# Patient Record
Sex: Female | Born: 1980 | Race: White | Hispanic: No | Smoking: Former smoker
Health system: Southern US, Community
[De-identification: ages and names within clinical notes are randomized; demographics above are authoritative.]

## PROBLEM LIST (undated history)

## (undated) DIAGNOSIS — Z8619 Personal history of other infectious and parasitic diseases: Secondary | ICD-10-CM

## (undated) DIAGNOSIS — O24419 Gestational diabetes mellitus in pregnancy, unspecified control: Secondary | ICD-10-CM

## (undated) HISTORY — DX: Personal history of other infectious and parasitic diseases: Z86.19

## (undated) HISTORY — DX: Gestational diabetes mellitus in pregnancy, unspecified control: O24.419

## (undated) HISTORY — PX: WISDOM TOOTH EXTRACTION: SHX21

---
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
## 1998-06-29 ENCOUNTER — Other Ambulatory Visit: Admission: RE | Admit: 1998-06-29 | Discharge: 1998-06-29 | Payer: Self-pay | Admitting: Obstetrics and Gynecology

## 1999-07-21 ENCOUNTER — Other Ambulatory Visit: Admission: RE | Admit: 1999-07-21 | Discharge: 1999-07-21 | Payer: Self-pay | Admitting: Obstetrics and Gynecology

## 2000-10-13 ENCOUNTER — Inpatient Hospital Stay (HOSPITAL_COMMUNITY): Admission: AD | Admit: 2000-10-13 | Discharge: 2000-10-15 | Payer: Self-pay | Admitting: Obstetrics and Gynecology

## 2001-09-05 ENCOUNTER — Other Ambulatory Visit: Admission: RE | Admit: 2001-09-05 | Discharge: 2001-09-05 | Payer: Self-pay | Admitting: Obstetrics and Gynecology

## 2012-08-28 LAB — OB RESULTS CONSOLE HEPATITIS B SURFACE ANTIGEN: Hepatitis B Surface Ag: NEGATIVE

## 2012-08-28 LAB — OB RESULTS CONSOLE ANTIBODY SCREEN: Antibody Screen: NEGATIVE

## 2012-08-28 LAB — OB RESULTS CONSOLE HIV ANTIBODY (ROUTINE TESTING): HIV: NONREACTIVE

## 2012-08-28 LAB — OB RESULTS CONSOLE RUBELLA ANTIBODY, IGM: Rubella: IMMUNE

## 2012-08-28 LAB — OB RESULTS CONSOLE ABO/RH: RH Type: POSITIVE

## 2012-08-28 LAB — OB RESULTS CONSOLE RPR: RPR: NONREACTIVE

## 2012-09-12 LAB — OB RESULTS CONSOLE GC/CHLAMYDIA
CHLAMYDIA, DNA PROBE: NEGATIVE
GC PROBE AMP, GENITAL: NEGATIVE

## 2013-02-06 ENCOUNTER — Encounter: Payer: BC Managed Care – PPO | Attending: Certified Nurse Midwife

## 2013-02-06 DIAGNOSIS — Z713 Dietary counseling and surveillance: Secondary | ICD-10-CM | POA: Insufficient documentation

## 2013-02-06 DIAGNOSIS — O99810 Abnormal glucose complicating pregnancy: Secondary | ICD-10-CM | POA: Insufficient documentation

## 2013-02-06 NOTE — Progress Notes (Addendum)
  Patient was seen on 02/06/13 for Gestational Diabetes self-management class at the [facility]. The following learning objectives were met by the patient during this course:   States the definition of Gestational Diabetes  States why dietary management is important in controlling blood glucose  Describes the effects of carbohydrates on blood glucose levels  Demonstrates ability to create a balanced meal plan  Demonstrates carbohydrate counting   States when to check blood glucose levels  Demonstrates proper blood glucose monitoring techniques  States the effect of stress and exercise on blood glucose levels  States the importance of limiting caffeine and abstaining from alcohol and smoking  Plan:  Aim for 2 Carb Choices per meal (30 grams) +/- 1 either way for breakfast Aim for 3 Carb Choices per meal (45 grams) +/- 1 either way from lunch and dinner Aim for 1-2 Carbs per snack Begin reading food labels for Total Carbohydrate and sugar grams of foods Consider  increasing your activity level by walking daily as tolerated Begin checking BG before breakfast and 1-2 hours after first bit of breakfast, lunch and dinner after  as directed by MD  Take medication  as directed by MD  Blood glucose monitor given:None...already testing per MD (correction)  Patient instructed to monitor glucose levels: FBS: 60 - <90 2 hour: <120  Patient received the following handouts:  Nutrition Diabetes and Pregnancy  Carbohydrate Counting List  Meal Planning worksheet  Patient will be seen for follow-up as needed.

## 2013-02-21 NOTE — L&D Delivery Note (Signed)
Delivery Note At 1:34 PM a viable and healthy female was delivered via Vaginal, Spontaneous Delivery (Presentation: LOA ).  APGAR: 8, 9; weight .   Placenta status: Intact, Spontaneous.  Cord: 3 vessels with the following complications: None.  Cord pH: na  Anesthesia: Epidural  Episiotomy: None Lacerations: None Suture Repair: na Est. Blood Loss (mL): 200  Mom to postpartum.  Baby to Couplet care / Skin to Skin.  Emmanuell Kantz J 04/05/2013, 1:54 PM

## 2013-03-06 ENCOUNTER — Inpatient Hospital Stay (HOSPITAL_COMMUNITY): Admission: AD | Admit: 2013-03-06 | Payer: Self-pay | Source: Ambulatory Visit | Admitting: Obstetrics and Gynecology

## 2013-03-06 LAB — OB RESULTS CONSOLE GBS: GBS: NEGATIVE

## 2013-03-29 ENCOUNTER — Other Ambulatory Visit: Payer: Self-pay | Admitting: Obstetrics and Gynecology

## 2013-04-01 ENCOUNTER — Encounter (HOSPITAL_COMMUNITY): Payer: Self-pay | Admitting: *Deleted

## 2013-04-01 ENCOUNTER — Telehealth (HOSPITAL_COMMUNITY): Payer: Self-pay | Admitting: *Deleted

## 2013-04-01 NOTE — Telephone Encounter (Signed)
Preadmission screen  

## 2013-04-05 ENCOUNTER — Inpatient Hospital Stay (HOSPITAL_COMMUNITY)
Admission: RE | Admit: 2013-04-05 | Discharge: 2013-04-06 | DRG: 775 | Disposition: A | Discharge status: Home / Self Care | Payer: BC Managed Care – PPO | Source: Ambulatory Visit | Attending: Obstetrics and Gynecology | Admitting: Obstetrics and Gynecology

## 2013-04-05 ENCOUNTER — Inpatient Hospital Stay (HOSPITAL_COMMUNITY): Payer: BC Managed Care – PPO | Admitting: Anesthesiology

## 2013-04-05 ENCOUNTER — Encounter (HOSPITAL_COMMUNITY): Payer: BC Managed Care – PPO | Admitting: Anesthesiology

## 2013-04-05 ENCOUNTER — Encounter (HOSPITAL_COMMUNITY): Payer: Self-pay

## 2013-04-05 DIAGNOSIS — O99814 Abnormal glucose complicating childbirth: Principal | ICD-10-CM | POA: Diagnosis present

## 2013-04-05 LAB — CBC
HCT: 35.2 % — ABNORMAL LOW (ref 36.0–46.0)
Hemoglobin: 11.9 g/dL — ABNORMAL LOW (ref 12.0–15.0)
MCH: 29.5 pg (ref 26.0–34.0)
MCHC: 33.8 g/dL (ref 30.0–36.0)
MCV: 87.1 fL (ref 78.0–100.0)
Platelets: 218 10*3/uL (ref 150–400)
RBC: 4.04 MIL/uL (ref 3.87–5.11)
RDW: 14.1 % (ref 11.5–15.5)
WBC: 11.4 10*3/uL — AB (ref 4.0–10.5)

## 2013-04-05 LAB — GLUCOSE, CAPILLARY
Glucose-Capillary: 88 mg/dL (ref 70–99)
Glucose-Capillary: 96 mg/dL (ref 70–99)

## 2013-04-05 LAB — RPR: RPR: NONREACTIVE

## 2013-04-05 MED ORDER — ZOLPIDEM TARTRATE 5 MG PO TABS: 5.0000 mg | ORAL_TABLET | Freq: Every evening | ORAL | Status: DC | PRN

## 2013-04-05 MED ORDER — TETANUS-DIPHTH-ACELL PERTUSSIS 5-2.5-18.5 LF-MCG/0.5 IM SUSP: 0.5000 mL | Freq: Once | INTRAMUSCULAR | Status: DC

## 2013-04-05 MED ORDER — OXYTOCIN 40 UNITS IN LACTATED RINGERS INFUSION - SIMPLE MED: 62.5000 mL/h | INTRAVENOUS | Status: DC

## 2013-04-05 MED ORDER — SENNOSIDES-DOCUSATE SODIUM 8.6-50 MG PO TABS
2.0000 | ORAL_TABLET | ORAL | Status: DC
  Administered 2013-04-06: 2 via ORAL
  Filled 2013-04-05: qty 2

## 2013-04-05 MED ORDER — DIBUCAINE 1 % RE OINT: 1.0000 "application " | TOPICAL_OINTMENT | RECTAL | Status: DC | PRN

## 2013-04-05 MED ORDER — DIPHENHYDRAMINE HCL 50 MG/ML IJ SOLN: 12.5000 mg | INTRAMUSCULAR | Status: DC | PRN

## 2013-04-05 MED ORDER — BUTORPHANOL TARTRATE 1 MG/ML IJ SOLN
1.0000 mg | INTRAMUSCULAR | Status: DC | PRN
  Administered 2013-04-05: 1 mg via INTRAVENOUS
  Filled 2013-04-05: qty 1

## 2013-04-05 MED ORDER — LIDOCAINE HCL (PF) 1 % IJ SOLN
30.0000 mL | INTRAMUSCULAR | Status: DC | PRN
  Filled 2013-04-05: qty 30

## 2013-04-05 MED ORDER — IBUPROFEN 600 MG PO TABS
600.0000 mg | ORAL_TABLET | Freq: Four times a day (QID) | ORAL | Status: DC
  Administered 2013-04-05 – 2013-04-06 (×5): 600 mg via ORAL
  Filled 2013-04-05 (×5): qty 1

## 2013-04-05 MED ORDER — PHENYLEPHRINE 40 MCG/ML (10ML) SYRINGE FOR IV PUSH (FOR BLOOD PRESSURE SUPPORT)
80.0000 ug | PREFILLED_SYRINGE | INTRAVENOUS | Status: DC | PRN
  Filled 2013-04-05: qty 2

## 2013-04-05 MED ORDER — EPHEDRINE 5 MG/ML INJ
10.0000 mg | INTRAVENOUS | Status: DC | PRN
  Filled 2013-04-05: qty 2
  Filled 2013-04-05: qty 4

## 2013-04-05 MED ORDER — PRENATAL MULTIVITAMIN CH
1.0000 | ORAL_TABLET | Freq: Every day | ORAL | Status: DC
  Administered 2013-04-06: 1 via ORAL
  Filled 2013-04-05: qty 1

## 2013-04-05 MED ORDER — ONDANSETRON HCL 4 MG PO TABS: 4.0000 mg | ORAL_TABLET | ORAL | Status: DC | PRN

## 2013-04-05 MED ORDER — ONDANSETRON HCL 4 MG/2ML IJ SOLN: 4.0000 mg | INTRAMUSCULAR | Status: DC | PRN

## 2013-04-05 MED ORDER — FENTANYL 2.5 MCG/ML BUPIVACAINE 1/10 % EPIDURAL INFUSION (WH - ANES)
14.0000 mL/h | INTRAMUSCULAR | Status: DC | PRN
  Administered 2013-04-05: 14 mL/h via EPIDURAL
  Filled 2013-04-05: qty 125

## 2013-04-05 MED ORDER — BENZOCAINE-MENTHOL 20-0.5 % EX AERO
1.0000 "application " | INHALATION_SPRAY | CUTANEOUS | Status: DC | PRN
  Administered 2013-04-05: 1 via TOPICAL
  Filled 2013-04-05 (×2): qty 56

## 2013-04-05 MED ORDER — LACTATED RINGERS IV SOLN: 500.0000 mL | INTRAVENOUS | Status: DC | PRN

## 2013-04-05 MED ORDER — CITRIC ACID-SODIUM CITRATE 334-500 MG/5ML PO SOLN: 30.0000 mL | ORAL | Status: DC | PRN

## 2013-04-05 MED ORDER — ACETAMINOPHEN 325 MG PO TABS: 650.0000 mg | ORAL_TABLET | ORAL | Status: DC | PRN

## 2013-04-05 MED ORDER — IBUPROFEN 600 MG PO TABS: 600.0000 mg | ORAL_TABLET | Freq: Four times a day (QID) | ORAL | Status: DC | PRN

## 2013-04-05 MED ORDER — OXYTOCIN 40 UNITS IN LACTATED RINGERS INFUSION - SIMPLE MED
1.0000 m[IU]/min | INTRAVENOUS | Status: DC
  Administered 2013-04-05: 2 m[IU]/min via INTRAVENOUS
  Filled 2013-04-05: qty 1000

## 2013-04-05 MED ORDER — EPHEDRINE 5 MG/ML INJ
10.0000 mg | INTRAVENOUS | Status: DC | PRN
  Filled 2013-04-05: qty 2

## 2013-04-05 MED ORDER — SIMETHICONE 80 MG PO CHEW: 80.0000 mg | CHEWABLE_TABLET | ORAL | Status: DC | PRN

## 2013-04-05 MED ORDER — LACTATED RINGERS IV SOLN
500.0000 mL | Freq: Once | INTRAVENOUS | Status: DC
Start: 2013-04-05 — End: 2013-04-05

## 2013-04-05 MED ORDER — LACTATED RINGERS IV SOLN
INTRAVENOUS | Status: DC
  Administered 2013-04-05: 07:00:00 via INTRAVENOUS

## 2013-04-05 MED ORDER — OXYCODONE-ACETAMINOPHEN 5-325 MG PO TABS: 1.0000 | ORAL_TABLET | ORAL | Status: DC | PRN

## 2013-04-05 MED ORDER — OXYTOCIN BOLUS FROM INFUSION: 500.0000 mL | INTRAVENOUS | Status: DC

## 2013-04-05 MED ORDER — METHYLERGONOVINE MALEATE 0.2 MG PO TABS: 0.2000 mg | ORAL_TABLET | ORAL | Status: DC | PRN

## 2013-04-05 MED ORDER — LANOLIN HYDROUS EX OINT: TOPICAL_OINTMENT | CUTANEOUS | Status: DC | PRN

## 2013-04-05 MED ORDER — PHENYLEPHRINE 40 MCG/ML (10ML) SYRINGE FOR IV PUSH (FOR BLOOD PRESSURE SUPPORT)
80.0000 ug | PREFILLED_SYRINGE | INTRAVENOUS | Status: DC | PRN
Start: 2013-04-05 — End: 2013-04-05
  Filled 2013-04-05: qty 10
  Filled 2013-04-05: qty 2

## 2013-04-05 MED ORDER — DIPHENHYDRAMINE HCL 25 MG PO CAPS: 25.0000 mg | ORAL_CAPSULE | Freq: Four times a day (QID) | ORAL | Status: DC | PRN

## 2013-04-05 MED ORDER — ONDANSETRON HCL 4 MG/2ML IJ SOLN: 4.0000 mg | Freq: Four times a day (QID) | INTRAMUSCULAR | Status: DC | PRN

## 2013-04-05 MED ORDER — WITCH HAZEL-GLYCERIN EX PADS
1.0000 | MEDICATED_PAD | CUTANEOUS | Status: DC | PRN
Start: 2013-04-05 — End: 2013-04-06

## 2013-04-05 MED ORDER — LIDOCAINE HCL (PF) 1 % IJ SOLN
INTRAMUSCULAR | Status: DC | PRN
Start: 2013-04-05 — End: 2013-04-07
  Administered 2013-04-05 (×2): 5 mL

## 2013-04-05 MED ORDER — TERBUTALINE SULFATE 1 MG/ML IJ SOLN: 0.2500 mg | Freq: Once | INTRAMUSCULAR | Status: DC | PRN

## 2013-04-05 MED ORDER — FLEET ENEMA 7-19 GM/118ML RE ENEM: 1.0000 | ENEMA | RECTAL | Status: DC | PRN

## 2013-04-05 MED ORDER — METHYLERGONOVINE MALEATE 0.2 MG/ML IJ SOLN: 0.2000 mg | INTRAMUSCULAR | Status: DC | PRN

## 2013-04-05 NOTE — Anesthesia Procedure Notes (Signed)
Epidural Patient location during procedure: OB Start time: 04/05/2013 1:07 PM  Staffing Anesthesiologist: Brayton CavesJACKSON, Avonna Iribe Performed by: anesthesiologist   Preanesthetic Checklist Completed: patient identified, site marked, surgical consent, pre-op evaluation, timeout performed, IV checked, risks and benefits discussed and monitors and equipment checked  Epidural Patient position: sitting Prep: site prepped and draped and DuraPrep Patient monitoring: continuous pulse ox and blood pressure Approach: midline Injection technique: LOR air  Needle:  Needle type: Tuohy  Needle gauge: 17 G Needle length: 9 cm and 9 Needle insertion depth: 5 cm cm Catheter type: closed end flexible Catheter size: 19 Gauge Catheter at skin depth: 10 cm Test dose: negative  Assessment Events: blood not aspirated, injection not painful, no injection resistance, negative IV test and no paresthesia  Additional Notes Patient identified.  Risk benefits discussed including failed block, incomplete pain control, headache, nerve damage, paralysis, blood pressure changes, nausea, vomiting, reactions to medication both toxic or allergic, and postpartum back pain.  Patient expressed understanding and wished to proceed.  All questions were answered.  Sterile technique used throughout procedure and epidural site dressed with sterile barrier dressing. No paresthesia or other complications noted.The patient did not experience any signs of intravascular injection such as tinnitus or metallic taste in mouth nor signs of intrathecal spread such as rapid motor block. Please see nursing notes for vital signs.

## 2013-04-05 NOTE — Anesthesia Preprocedure Evaluation (Signed)
Anesthesia Evaluation  Patient identified by MRN, date of birth, ID band Patient awake    Reviewed: Allergy & Precautions, H&P , Patient's Chart, lab work & pertinent test results  Airway Mallampati: II TM Distance: >3 FB Neck ROM: full    Dental   Pulmonary former smoker,  breath sounds clear to auscultation        Cardiovascular Rhythm:regular Rate:Normal     Neuro/Psych    GI/Hepatic   Endo/Other  diabetes  Renal/GU      Musculoskeletal   Abdominal   Peds  Hematology   Anesthesia Other Findings   Reproductive/Obstetrics (+) Pregnancy                           Anesthesia Physical Anesthesia Plan  ASA: III  Anesthesia Plan: Epidural   Post-op Pain Management:    Induction:   Airway Management Planned:   Additional Equipment:   Intra-op Plan:   Post-operative Plan:   Informed Consent: I have reviewed the patients History and Physical, chart, labs and discussed the procedure including the risks, benefits and alternatives for the proposed anesthesia with the patient or authorized representative who has indicated his/her understanding and acceptance.     Plan Discussed with:   Anesthesia Plan Comments:         Anesthesia Quick Evaluation

## 2013-04-05 NOTE — Progress Notes (Signed)
Cord blood not obtained at delivery. Mom blood type A+.

## 2013-04-05 NOTE — Lactation Note (Signed)
This note was copied from the chart of Cassandra Dorene SorrowCheri Chieffo. Lactation Consultation Note Baby Cassandra 479 hours old and sleeping in his cute outfit on mother's chest.  Mother resting.  P3.  Mother happy, she says he is breastfeeding great. Reviewed STS, deep wide latch, cluster feeding, lactation support services and brochure.  Encouraged mother to call for further assistance.  Patient Name: Cassandra Berger ZOXWR'UToday's Date: 04/05/2013 Reason for consult: Follow-up assessment   Maternal Data Infant to breast within first hour of birth: Yes Does the patient have breastfeeding experience prior to this delivery?: Yes  Feeding Feeding Type: Breast Fed Length of feed: 20 min  LATCH Score/Interventions Latch: Grasps breast easily, tongue down, lips flanged, rhythmical sucking.  Audible Swallowing: A few with stimulation Intervention(s): Skin to skin;Hand expression  Type of Nipple: Everted at rest and after stimulation  Comfort (Breast/Nipple): Soft / non-tender     Hold (Positioning): No assistance needed to correctly position infant at breast.  LATCH Score: 9  Lactation Tools Discussed/Used     Consult Status Consult Status: Follow-up Date: 04/06/13 Follow-up type: In-patient    [name], Cassandra Berger 04/05/2013, 10:41 PM

## 2013-04-05 NOTE — Anesthesia Postprocedure Evaluation (Signed)
Anesthesia Post Note  Patient: Cassandra Berger  Procedure(s) Performed: * No procedures listed *  Anesthesia type: Epidural  Patient location: Mother/Baby  Post pain: Pain level controlled  Post assessment: Post-op Vital signs reviewed  Last Vitals:  Filed Vitals:   04/05/13 1700  BP: 110/63  Pulse: 80  Temp: 36.7 C  Resp: 18    Post vital signs: Reviewed  Level of consciousness:alert  Complications: No apparent anesthesia complications

## 2013-04-05 NOTE — H&P (Signed)
Cassandra Berger is a 33 y.o. female presenting for induction for A2DM. Maternal Medical History:  Contractions: Frequency: rare.   Perceived severity is mild.    Fetal activity: Perceived fetal activity is normal.   Last perceived fetal movement was within the past hour.    Prenatal complications: no prenatal complications Prenatal Complications - Diabetes: type 2. Diabetes is managed by diet and oral agent (monotherapy).      OB History   Grav Para Term Preterm Abortions TAB SAB Ect Mult Living   3 2 2       1      Past Medical History  Diagnosis Date  . Hx of varicella   . Gestational diabetes    No past surgical history on file. Family History: family history includes Anemia in her mother; Cancer in her maternal grandmother, maternal uncle, and paternal grandfather; Diabetes in her maternal aunt; Heart disease in her maternal grandfather and paternal grandmother; Rheumatic fever in her mother; Stroke in her paternal grandmother. Social History:  has no tobacco, alcohol, and drug history on file.   Prenatal Transfer Tool  Maternal Diabetes: Yes:  Diabetes Type:  Insulin/Medication controlled Genetic Screening: Normal Maternal Ultrasounds/Referrals: Normal Fetal Ultrasounds or other Referrals:  None Maternal Substance Abuse:  No Significant Maternal Medications:  None Significant Maternal Lab Results:  None Other Comments:  None  Review of Systems  All other systems reviewed and are negative.      Last menstrual period 06/29/2012. Maternal Exam:  Uterine Assessment: Contraction strength is mild.  Contraction frequency is irregular.   Abdomen: Patient reports no abdominal tenderness. Fetal presentation: vertex  Introitus: Normal vulva. Normal vagina.  Ferning test: not done.  Nitrazine test: not done. Amniotic fluid character: not assessed.  Pelvis: adequate for delivery.   Cervix: Cervix evaluated by digital exam.     Physical Exam  Constitutional: She is  oriented to person, place, and time. She appears well-developed and well-nourished.  Eyes: Pupils are equal, round, and reactive to light.  Neck: Normal range of motion.  Cardiovascular: Normal rate and regular rhythm.   Respiratory: Effort normal and breath sounds normal.  GI: Soft.  Genitourinary: Vagina normal and uterus normal.  Musculoskeletal: Normal range of motion.  Neurological: She is alert and oriented to person, place, and time.  Skin: Skin is warm and dry.    Prenatal labs: ABO, Rh: A/Positive/-- (07/08 0000) Antibody: Negative (07/08 0000) Rubella: Immune (07/08 0000) RPR: Nonreactive (07/08 0000)  HBsAg: Negative (07/08 0000)  HIV: Non-reactive (07/08 0000)  GBS: Negative (01/14 0000)   Assessment/Plan: Class A2 DM at 39 weeks for induction Pitocin   Cassandra Berger J 04/05/2013, 6:56 AM

## 2013-04-06 LAB — CBC
HEMATOCRIT: 33.1 % — AB (ref 36.0–46.0)
HEMOGLOBIN: 11.3 g/dL — AB (ref 12.0–15.0)
MCH: 29.9 pg (ref 26.0–34.0)
MCHC: 34.1 g/dL (ref 30.0–36.0)
MCV: 87.6 fL (ref 78.0–100.0)
Platelets: 202 10*3/uL (ref 150–400)
RBC: 3.78 MIL/uL — ABNORMAL LOW (ref 3.87–5.11)
RDW: 14 % (ref 11.5–15.5)
WBC: 14.6 10*3/uL — AB (ref 4.0–10.5)

## 2013-04-06 MED ORDER — IBUPROFEN 600 MG PO TABS: 600.0000 mg | ORAL_TABLET | Freq: Four times a day (QID) | ORAL | Status: AC | PRN

## 2013-04-06 NOTE — Discharge Summary (Signed)
Obstetric Discharge Summary Reason for Admission: G3 P2 0 0 2 for IOL.  Hx GDM (A2) Prenatal Procedures: NST and ultrasound Intrapartum Procedures: spontaneous vaginal delivery Postpartum Procedures: none Complications-Operative and Postpartum: none Hemoglobin  Date Value Ref Range Status  04/06/2013 11.3* 12.0 - 15.0 g/dL Final     HCT  Date Value Ref Range Status  04/06/2013 33.1* 36.0 - 46.0 % Final    Physical Exam:  General: alert, cooperative and no distress Lochia: appropriate Uterine Fundus: firm Incision: n/a DVT Evaluation: No evidence of DVT seen on physical exam. Negative Homan's sign.  Discharge Diagnoses: G3 P3 0 0 3 s/p SVD; GDM (A2) resolving, delivered  Discharge Information: Date: 04/06/2013 Activity: pelvic rest Diet: routine Medications: PNV and Ibuprofen Condition: stable Instructions: refer to practice specific booklet Discharge to: home Follow-up Information   Follow up with Lenoard AdenAAVON,RICHARD J, MD In 6 weeks.   Specialty:  Obstetrics and Gynecology   Contact information:   [address] Severe[address] [place] [address] [phone]       Newborn Data: Live born female on 04/05/13 Birth Weight: 8 lb 2 oz (3685 g) APGAR: 8, 9  Home with mother.  Haydyn Liddell K 04/06/2013, 1:10 PM

## 2013-04-06 NOTE — Progress Notes (Addendum)
Patient ID: Cassandra Berger, female   DOB: 1980-06-18, 33 y.o.   MRN: [id number] PPD # 1  Subjective: Pt reports feeling well and eager for early d/c home/ Pain controlled with ibuprofen Tolerating po/ Voiding without problems/ No n/v Bleeding is light/ Newborn info:  Information for the patient's newborn:  Rocco SereneLineberry, Boy Ambri [[id number]][[id number]]  female  / circ just performed by Dr Billy Coastaavon/ Feeding: breast    Objective:  VS: Blood pressure 110/59, pulse 89, temperature 98.7 F (37.1 C), temperature source Oral, resp. rate 12.    Recent Labs  04/05/13 0705 04/06/13 0622  WBC 11.4* 14.6*  HGB 11.9* 11.3*  HCT 35.2* 33.1*  PLT 218 202    Blood type: A/Positive/-- (07/08 0000) Rubella: Immune (07/08 0000)    Physical Exam:  General:  alert, cooperative and no distress CV: Regular rate and rhythm Resp: clear Abdomen: soft, nontender, normal bowel sounds Uterine Fundus: firm, below umbilicus, nontender Perineum: intact; no edema Lochia: minimal Ext: edema trace and Homans sign is negative, no sign of DVT    A/P: PPD # 1/ G3P3003/ S/P: SVD GDM (A1); delivered, stable Doing well and stable for discharge home RX: Ibuprofen 600mg  po Q 6 hrs prn pain #30 Refill x 1 WOB/GYN booklet given Routine pp visit in 6wks   Demetrius RevelFISHER,Georgeanna Radziewicz K, MSN, [facility] 04/06/2013, 12:57 PM

## 2013-12-23 ENCOUNTER — Encounter (HOSPITAL_COMMUNITY): Payer: Self-pay

## 2017-08-29 ENCOUNTER — Other Ambulatory Visit: Payer: Self-pay | Admitting: General Surgery

## 2017-08-29 DIAGNOSIS — N6452 Nipple discharge: Secondary | ICD-10-CM

## 2017-09-26 ENCOUNTER — Ambulatory Visit
Admission: RE | Admit: 2017-09-26 | Discharge: 2017-09-26 | Disposition: A | Discharge status: Home / Self Care | Payer: BC Managed Care – PPO | Source: Ambulatory Visit | Attending: General Surgery | Admitting: General Surgery

## 2017-09-26 ENCOUNTER — Other Ambulatory Visit: Payer: Self-pay | Admitting: General Surgery

## 2017-09-26 DIAGNOSIS — N6310 Unspecified lump in the right breast, unspecified quadrant: Secondary | ICD-10-CM

## 2017-09-26 DIAGNOSIS — N6452 Nipple discharge: Secondary | ICD-10-CM

## 2019-08-05 ENCOUNTER — Other Ambulatory Visit: Payer: Self-pay | Admitting: Family Medicine

## 2019-08-05 DIAGNOSIS — Z1231 Encounter for screening mammogram for malignant neoplasm of breast: Secondary | ICD-10-CM

## 2019-08-07 ENCOUNTER — Other Ambulatory Visit: Payer: Self-pay

## 2019-08-07 ENCOUNTER — Ambulatory Visit
Admission: RE | Admit: 2019-08-07 | Discharge: 2019-08-07 | Disposition: A | Discharge status: Home / Self Care | Payer: BC Managed Care – PPO | Source: Ambulatory Visit | Attending: Family Medicine | Admitting: Family Medicine

## 2019-08-07 DIAGNOSIS — Z1231 Encounter for screening mammogram for malignant neoplasm of breast: Secondary | ICD-10-CM

## 2019-08-09 ENCOUNTER — Other Ambulatory Visit: Payer: Self-pay | Admitting: Family Medicine

## 2019-08-09 DIAGNOSIS — R928 Other abnormal and inconclusive findings on diagnostic imaging of breast: Secondary | ICD-10-CM

## 2019-08-15 ENCOUNTER — Other Ambulatory Visit: Payer: Self-pay

## 2019-08-15 ENCOUNTER — Ambulatory Visit
Admission: RE | Admit: 2019-08-15 | Discharge: 2019-08-15 | Disposition: A | Discharge status: Home / Self Care | Payer: BC Managed Care – PPO | Source: Ambulatory Visit | Attending: Family Medicine | Admitting: Family Medicine

## 2019-08-15 DIAGNOSIS — R928 Other abnormal and inconclusive findings on diagnostic imaging of breast: Secondary | ICD-10-CM

## 2020-05-28 ENCOUNTER — Telehealth: Payer: Self-pay

## 2020-05-28 NOTE — Telephone Encounter (Signed)
Opened in error

## 2020-09-24 ENCOUNTER — Other Ambulatory Visit: Payer: Self-pay | Admitting: Family Medicine

## 2020-09-24 DIAGNOSIS — Z1231 Encounter for screening mammogram for malignant neoplasm of breast: Secondary | ICD-10-CM

## 2020-09-26 ENCOUNTER — Other Ambulatory Visit: Payer: Self-pay

## 2020-09-26 ENCOUNTER — Ambulatory Visit
Admission: RE | Admit: 2020-09-26 | Discharge: 2020-09-26 | Disposition: A | Discharge status: Home / Self Care | Payer: BC Managed Care – PPO | Source: Ambulatory Visit | Attending: Family Medicine | Admitting: Family Medicine

## 2020-09-26 DIAGNOSIS — Z1231 Encounter for screening mammogram for malignant neoplasm of breast: Secondary | ICD-10-CM

## 2021-08-26 ENCOUNTER — Other Ambulatory Visit: Payer: Self-pay | Admitting: Family Medicine

## 2021-08-26 DIAGNOSIS — Z1231 Encounter for screening mammogram for malignant neoplasm of breast: Secondary | ICD-10-CM

## 2021-09-27 ENCOUNTER — Ambulatory Visit: Payer: BC Managed Care – PPO

## 2021-09-29 ENCOUNTER — Ambulatory Visit
Admission: RE | Admit: 2021-09-29 | Discharge: 2021-09-29 | Disposition: A | Discharge status: Home / Self Care | Payer: BC Managed Care – PPO | Source: Ambulatory Visit | Attending: Family Medicine | Admitting: Family Medicine

## 2021-09-29 DIAGNOSIS — Z1231 Encounter for screening mammogram for malignant neoplasm of breast: Secondary | ICD-10-CM

## 2022-01-25 ENCOUNTER — Other Ambulatory Visit: Payer: Self-pay

## 2022-01-25 ENCOUNTER — Emergency Department (HOSPITAL_COMMUNITY): Payer: BC Managed Care – PPO

## 2022-01-25 ENCOUNTER — Emergency Department (HOSPITAL_COMMUNITY)
Admission: EM | Admit: 2022-01-25 | Discharge: 2022-01-26 | Disposition: A | Discharge status: Home / Self Care | Payer: BC Managed Care – PPO | Attending: Emergency Medicine | Admitting: Emergency Medicine

## 2022-01-25 DIAGNOSIS — R1032 Left lower quadrant pain: Secondary | ICD-10-CM | POA: Diagnosis present

## 2022-01-25 DIAGNOSIS — N200 Calculus of kidney: Secondary | ICD-10-CM

## 2022-01-25 DIAGNOSIS — N132 Hydronephrosis with renal and ureteral calculous obstruction: Secondary | ICD-10-CM | POA: Diagnosis not present

## 2022-01-25 LAB — CBC WITH DIFFERENTIAL/PLATELET
Abs Immature Granulocytes: 0.07 10*3/uL (ref 0.00–0.07)
Basophils Absolute: 0.1 10*3/uL (ref 0.0–0.1)
Basophils Relative: 0 %
Eosinophils Absolute: 0.1 10*3/uL (ref 0.0–0.5)
Eosinophils Relative: 0 %
HCT: 38.1 % (ref 36.0–46.0)
Hemoglobin: 12.9 g/dL (ref 12.0–15.0)
Immature Granulocytes: 1 %
Lymphocytes Relative: 11 %
Lymphs Abs: 1.8 10*3/uL (ref 0.7–4.0)
MCH: 28.7 pg (ref 26.0–34.0)
MCHC: 33.9 g/dL (ref 30.0–36.0)
MCV: 84.7 fL (ref 80.0–100.0)
Monocytes Absolute: 0.5 10*3/uL (ref 0.1–1.0)
Monocytes Relative: 3 %
Neutro Abs: 13 10*3/uL — ABNORMAL HIGH (ref 1.7–7.7)
Neutrophils Relative %: 85 %
Platelets: 331 10*3/uL (ref 150–400)
RBC: 4.5 MIL/uL (ref 3.87–5.11)
RDW: 12.7 % (ref 11.5–15.5)
WBC: 15.5 10*3/uL — ABNORMAL HIGH (ref 4.0–10.5)
nRBC: 0 % (ref 0.0–0.2)

## 2022-01-25 LAB — BASIC METABOLIC PANEL
Anion gap: 11 (ref 5–15)
BUN: 13 mg/dL (ref 6–20)
CO2: 23 mmol/L (ref 22–32)
Calcium: 9.7 mg/dL (ref 8.9–10.3)
Chloride: 103 mmol/L (ref 98–111)
Creatinine, Ser: 0.8 mg/dL (ref 0.44–1.00)
GFR, Estimated: >60 mL/min (ref >60)
Glucose, Bld: 149 mg/dL — ABNORMAL HIGH (ref 70–99)
Potassium: 3.7 mmol/L (ref 3.5–5.1)
Sodium: 137 mmol/L (ref 135–145)

## 2022-01-25 LAB — URINALYSIS, ROUTINE W REFLEX MICROSCOPIC
Bilirubin Urine: NEGATIVE
Glucose, UA: NEGATIVE mg/dL
Ketones, ur: 5 mg/dL — AB
Nitrite: NEGATIVE
Protein, ur: 30 mg/dL — AB
RBC / HPF: >50 RBC/hpf — ABNORMAL HIGH (ref 0–5)
Specific Gravity, Urine: 1.019 (ref 1.005–1.030)
pH: 7 (ref 5.0–8.0)

## 2022-01-25 LAB — I-STAT BETA HCG BLOOD, ED (MC, WL, AP ONLY): I-stat hCG, quantitative: <5 m[IU]/mL (ref <5)

## 2022-01-25 MED ORDER — ONDANSETRON 4 MG PO TBDP
4.0000 mg | ORAL_TABLET | Freq: Once | ORAL | Status: AC
  Administered 2022-01-25: 4 mg via ORAL
  Filled 2022-01-25: qty 1

## 2022-01-25 MED ORDER — OXYCODONE-ACETAMINOPHEN 5-325 MG PO TABS
1.0000 | ORAL_TABLET | Freq: Once | ORAL | Status: AC
  Administered 2022-01-25: 1 via ORAL
  Filled 2022-01-25: qty 1

## 2022-01-25 NOTE — ED Triage Notes (Signed)
Pt arrived complaining of sharp left sided flank pain that started this afternoon and has been getting worse and worse  Pt also started vomiting because of pain  Denies hx of pain like this before

## 2022-01-25 NOTE — ED Provider Triage Note (Signed)
Emergency Medicine Provider Triage Evaluation Note  Cassandra Berger , a 41 y.o. female  was evaluated in triage.  Pt complains of left flank pain with nausea and vomiting since about 4 PM.  No fever.  No pain with urination or blood in the urine.  No history of kidney stones..  Review of Systems  Positive: Flank pain, nausea and vomiting Negative: Dysuria, hematuria  Physical Exam  BP 124/78 (BP Location: Right Arm)   Pulse 88   Temp 98.5 F (36.9 C) (Oral)   Resp 18   Ht 5\' 3"  (1.6 m)   Wt 77.1 kg   SpO2 100%   BMI 30.11 kg/m  Gen:   Awake, no distress   Resp:  Normal effort  MSK:   Moves extremities without difficulty  Other:  LCVAT  Medical Decision Making  Medically screening exam initiated at 9:25 PM.  Appropriate orders placed.  SONORA CATLIN was informed that the remainder of the evaluation will be completed by another provider, this initial triage assessment does not replace that evaluation, and the importance of remaining in the ED until their evaluation is complete.  Left flank pain with nausea and vomiting, rule out kidney stone   Hoy Finlay, MD 01/25/22 2125

## 2022-01-26 MED ORDER — KETOROLAC TROMETHAMINE 30 MG/ML IJ SOLN
30.0000 mg | Freq: Once | INTRAMUSCULAR | Status: AC
  Administered 2022-01-26: 30 mg via INTRAMUSCULAR
  Filled 2022-01-26: qty 1

## 2022-01-26 MED ORDER — OXYCODONE-ACETAMINOPHEN 5-325 MG PO TABS
1.0000 | ORAL_TABLET | Freq: Once | ORAL | Status: AC
  Administered 2022-01-26: 1 via ORAL
  Filled 2022-01-26: qty 1

## 2022-01-26 MED ORDER — OXYCODONE-ACETAMINOPHEN 5-325 MG PO TABS: 1.0000 | ORAL_TABLET | Freq: Four times a day (QID) | ORAL | 0 refills | Status: AC | PRN

## 2022-01-26 NOTE — ED Provider Notes (Signed)
[facility] EMERGENCY DEPARTMENT Provider Note   CSN: [id number] Arrival date & time: 01/25/22  2042     History  Chief Complaint  Patient presents with   Flank Pain    Cassandra Berger is a 41 y.o. female.  41 year old female presents to the emergency department for evaluation of left flank pain which began at 1600.  Symptoms have been constant and waxing and waning in severity.  When pain was severe prior to arrival, patient developed nausea as well as vomiting.  She states that pain has improved a bit at this time.  Received Percocet in triage for pain.  Denies fever, dysuria, hematuria, bowel changes.  No history of prior kidney stones or previous abdominal surgeries.  The history is provided by the patient. No language interpreter was used.  Flank Pain       Home Medications Prior to Admission medications   Medication Sig Start Date End Date Taking? Authorizing Provider  oxyCODONE-acetaminophen (PERCOCET/ROXICET) 5-325 MG tablet Take 1-2 tablets by mouth every 6 (six) hours as needed for severe pain. 01/26/22  Yes Antony Madura, PA-C  Evening Primrose Oil 1000 MG CAPS Take 1 capsule by mouth 2 (two) times daily.    [provider]  folic acid (FOLVITE) 400 MCG tablet Take 400 mcg by mouth daily.    [provider]  ibuprofen (ADVIL,MOTRIN) 600 MG tablet Take 1 tablet (600 mg total) by mouth every 6 (six) hours as needed. 04/06/13   Arlana Lindau, NP  metFORMIN (GLUCOPHAGE) 500 MG tablet Take 500 mg by mouth at bedtime.     [provider]  Prenatal Vit-Fe Sulfate-FA (PRENATAL VITAMIN PO) Take 1 tablet by mouth daily.     [provider]      Allergies    Amoxil [amoxicillin] and Penicillins    Review of Systems   Review of Systems  Genitourinary:  Positive for flank pain.  Ten systems reviewed and are negative for acute change, except as noted in the HPI.    Physical Exam Updated Vital Signs BP 104/64 (BP  Location: Right Arm)   Pulse 78   Temp 98.3 F (36.8 C) (Oral)   Resp 16   Ht 5\' 3"  (1.6 m)   Wt 77.1 kg   SpO2 93%   BMI 30.11 kg/m   Physical Exam Vitals and nursing note reviewed.  Constitutional:      General: She is not in acute distress.    Appearance: She is well-developed. She is not diaphoretic.     Comments: Nontoxic appearing and in NAD  HENT:     Head: Normocephalic and atraumatic.  Eyes:     General: No scleral icterus.    Conjunctiva/sclera: Conjunctivae normal.  Pulmonary:     Effort: Pulmonary effort is normal. No respiratory distress.     Comments: Respirations even and unlabored Abdominal:     General: There is no distension.  Musculoskeletal:        General: Normal range of motion.     Cervical back: Normal range of motion.  Skin:    General: Skin is warm and dry.     Coloration: Skin is not pale.     Findings: No erythema or rash.  Neurological:     Mental Status: She is alert and oriented to person, place, and time.  Psychiatric:        Behavior: Behavior normal.     ED Results / Procedures / Treatments   Labs (all labs ordered  are listed, but only abnormal results are displayed) Labs Reviewed  CBC WITH DIFFERENTIAL/PLATELET - Abnormal; Notable for the following components:      Result Value   WBC 15.5 (*)    Neutro Abs 13.0 (*)    All other components within normal limits  BASIC METABOLIC PANEL - Abnormal; Notable for the following components:   Glucose, Bld 149 (*)    All other components within normal limits  URINALYSIS, ROUTINE W REFLEX MICROSCOPIC - Abnormal; Notable for the following components:   APPearance CLOUDY (*)    Hgb urine dipstick LARGE (*)    Ketones, ur 5 (*)    Protein, ur 30 (*)    Leukocytes,Ua TRACE (*)    RBC / HPF >50 (*)    Bacteria, UA RARE (*)    All other components within normal limits  I-STAT BETA HCG BLOOD, ED ([facility], [facility], [facility] ONLY)    EKG None  Radiology CT Renal Stone Study  Result Date:  01/25/2022 CLINICAL DATA:  Abdominal/flank pain.  Left flank pain, vomiting EXAM: CT ABDOMEN AND PELVIS WITHOUT CONTRAST TECHNIQUE: Multidetector CT imaging of the abdomen and pelvis was performed following the standard protocol without IV contrast. RADIATION DOSE REDUCTION: This exam was performed according to the departmental dose-optimization program which includes automated exposure control, adjustment of the mA and/or kV according to patient size and/or use of iterative reconstruction technique. COMPARISON:  None Available. FINDINGS: Lower chest: No acute abnormality. Hepatobiliary: Moderate hepatic steatosis. No enhancing intrahepatic mass. No intra or extrahepatic biliary ductal dilation. Gallbladder unremarkable. Pancreas: Unremarkable Spleen: Unremarkable Adrenals/Urinary Tract: The adrenal glands are unremarkable. The kidneys are normal in size and position. Mild left hydronephrosis is present secondary to an obstructing 4 mm calculus at the left ureterovesicular junction. No additional nephro or urolithiasis. No hydronephrosis on the right. Simple exophytic cortical cyst arises from the upper pole the left kidney. No follow-up imaging is recommended for this lesion. The bladder is unremarkable. Stomach/Bowel: Stomach is within normal limits. Appendix appears normal. No evidence of bowel wall thickening, distention, or inflammatory changes. No free intraperitoneal gas or fluid. Vascular/Lymphatic: No significant vascular findings are present. No enlarged abdominal or pelvic lymph nodes. Reproductive: Intrauterine device in expected position within the uterus. The pelvic organs are otherwise unremarkable. Other: Small fat containing umbilical hernia. Musculoskeletal: No acute or significant osseous findings. IMPRESSION: 1. Obstructing 4 mm calculus at the left ureterovesicular junction resulting in mild left hydronephrosis. 2. Moderate hepatic steatosis. Electronically Signed   By: Helyn Numbers M.D.   On:  01/25/2022 23:11    Procedures Procedures    Medications Ordered in ED Medications  oxyCODONE-acetaminophen (PERCOCET/ROXICET) 5-325 MG per tablet 1 tablet (1 tablet Oral Given 01/25/22 2130)  ondansetron (ZOFRAN-ODT) disintegrating tablet 4 mg (4 mg Oral Given 01/25/22 2130)  ketorolac (TORADOL) 30 MG/ML injection 30 mg (30 mg Intramuscular Given 01/26/22 0352)  oxyCODONE-acetaminophen (PERCOCET/ROXICET) 5-325 MG per tablet 1 tablet (1 tablet Oral Given 01/26/22 7035)    ED Course/ Medical Decision Making/ A&P                           Medical Decision Making Risk Prescription drug management.   This patient presents to the ED for concern of L flank pain, this involves an extensive number of treatment options, and is a complaint that carries with it a high risk of complications and morbidity.  The differential diagnosis includes kidney stone vs pyelonephritis vs MSK etiology  vs ovarian torsion vs ovarian cyst vs retroperitoneal hematoma   Co morbidities that complicate the patient evaluation  Nont    Lab Tests:  I Ordered, and personally interpreted labs.  The pertinent results include:  CBC, bmp, and pregnancy; these were negative/reassuring. UA with hematuria, no UTI   Imaging Studies ordered:  I ordered imaging studies including CT renal stone study  I independently visualized and interpreted imaging which showed L distal ureteral kidney stone I agree with the radiologist interpretation   Cardiac Monitoring:  The patient was maintained on a cardiac monitor.  I personally viewed and interpreted the cardiac monitored which showed an underlying rhythm of: NSR   Medicines ordered and prescription drug management:  I ordered medication including Toradol and percocet for pain  Reevaluation of the patient after these medicines showed that the patient improved I have reviewed the patients home medicines and have made adjustments as needed   Test Considered:  Urine  culture   Problem List / ED Course:  Patient has been diagnosed with a kidney stone via CT. There is no evidence of significant hydronephrosis, serum creatine WNL, vitals sign stable and the pt does not have irratractable vomiting. Pt will be dc home with pain medications and has been advised to follow up with Urology if pain persists.   Reevaluation:  After the interventions noted above, I reevaluated the patient and found that they have :improved   Social Determinants of Health:  Insured patient   Dispostion:  After consideration of the diagnostic results and the patients response to treatment, I feel that the patent would benefit from Percocet for pain control. Return precautions discussed and provided. Patient discharged in stable condition with no unaddressed concerns.          Final Clinical Impression(s) / ED Diagnoses Final diagnoses:  Kidney stone    Rx / DC Orders ED Discharge Orders          Ordered    Ambulatory referral to Urology       Comments: Kidney stone   01/26/22 0346    oxyCODONE-acetaminophen (PERCOCET/ROXICET) 5-325 MG tablet  Every 6 hours PRN        01/26/22 0347              Antony Madura, PA-C 01/26/22 0603    Dione Booze, MD 01/26/22 [phone]

## 2022-01-26 NOTE — Discharge Instructions (Signed)
You have been prescribed Percocet to take for management of pain.  Do not drive or drink alcohol after taking this medication as it may make you drowsy and impair your judgment.  You can take Percocet with 600mg  ibuprofen if pain is still persistent.  If pain remains ongoing past another 2-3 days, we advise follow-up with urology.  You may also return to the ED for new or concerning symptoms.

## 2022-01-26 NOTE — ED Notes (Signed)
Patient requesting pain meds. Triage RN notified.

## 2022-10-03 ENCOUNTER — Other Ambulatory Visit: Payer: Self-pay | Admitting: Family Medicine

## 2022-10-03 DIAGNOSIS — Z1231 Encounter for screening mammogram for malignant neoplasm of breast: Secondary | ICD-10-CM

## 2022-10-12 ENCOUNTER — Ambulatory Visit: Admission: RE | Admit: 2022-10-12 | Payer: BC Managed Care – PPO | Source: Ambulatory Visit

## 2022-10-12 DIAGNOSIS — Z1231 Encounter for screening mammogram for malignant neoplasm of breast: Secondary | ICD-10-CM

## 2023-03-13 IMAGING — MG MM DIGITAL SCREENING BILAT W/ TOMO AND CAD
8 series · 8 of 24 positions shown · non-contrast
Comparison: Previous exam(s).

CLINICAL DATA: Screening.

EXAM:
DIGITAL SCREENING BILATERAL MAMMOGRAM WITH TOMOSYNTHESIS AND CAD
TECHNIQUE: Bilateral screening digital craniocaudal and mediolateral oblique
mammograms were obtained. Bilateral screening digital breast
tomosynthesis was performed. The images were evaluated with
computer-aided detection.

[R MLO synth-2D]
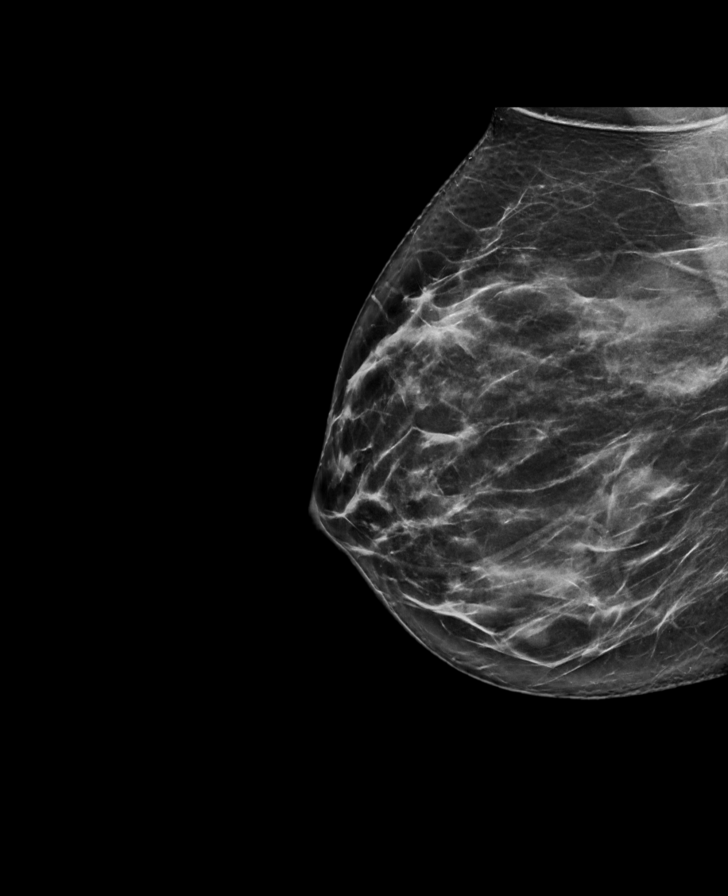

[L CC synth-2D]
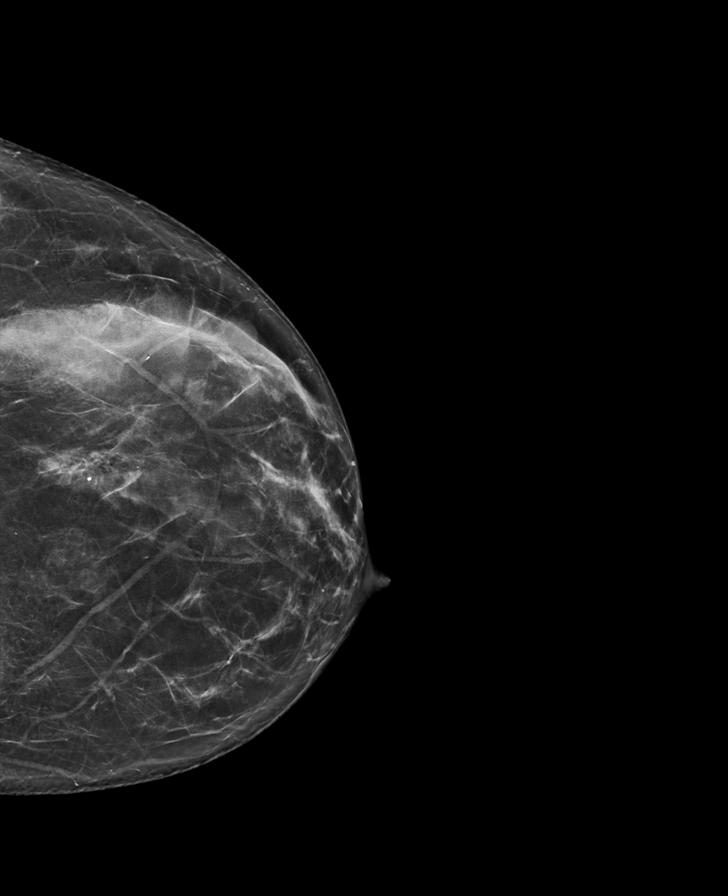

[R CC synth-2D]
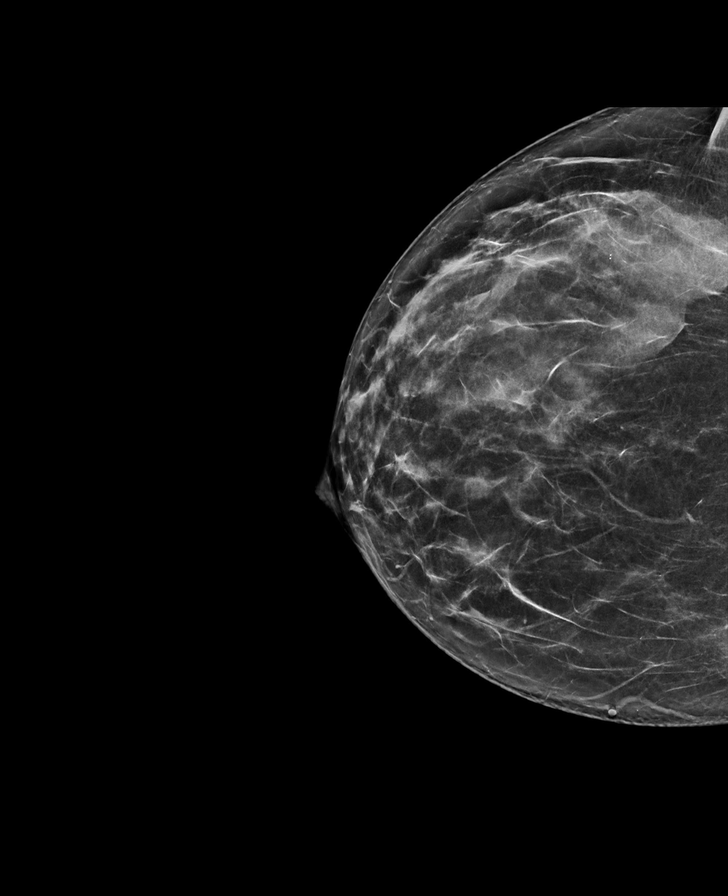

[L MLO synth-2D]
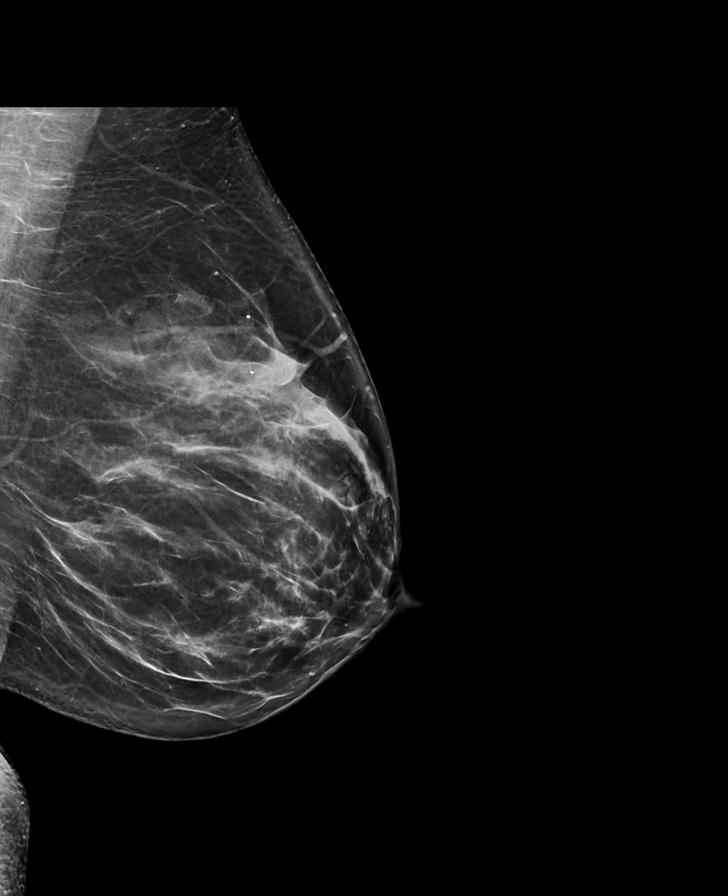

[L MLO tomo · tomo slice 42/83.0]
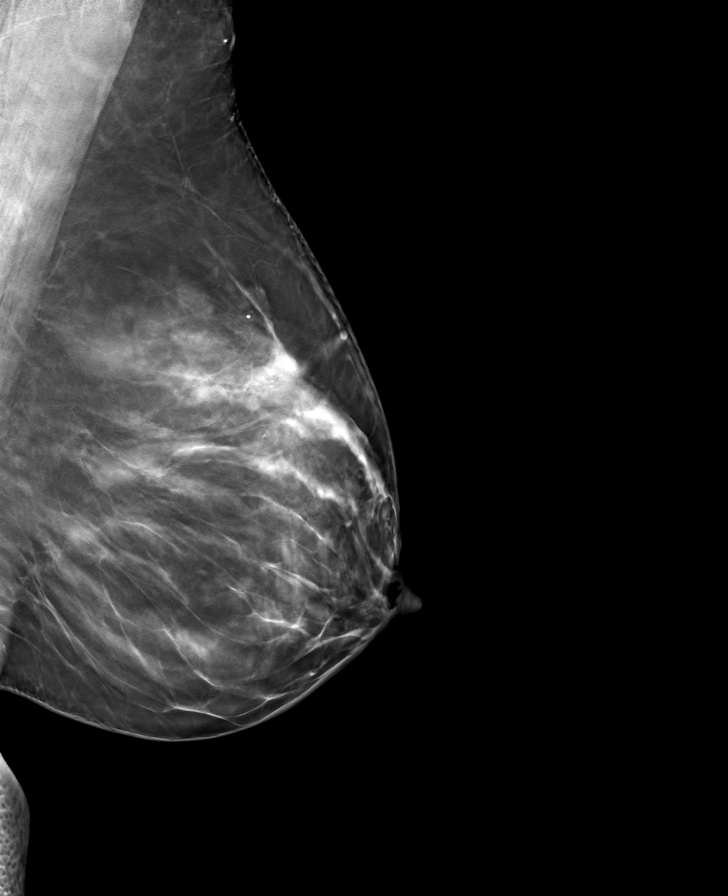

[R CC tomo · tomo slice 38/75.0]
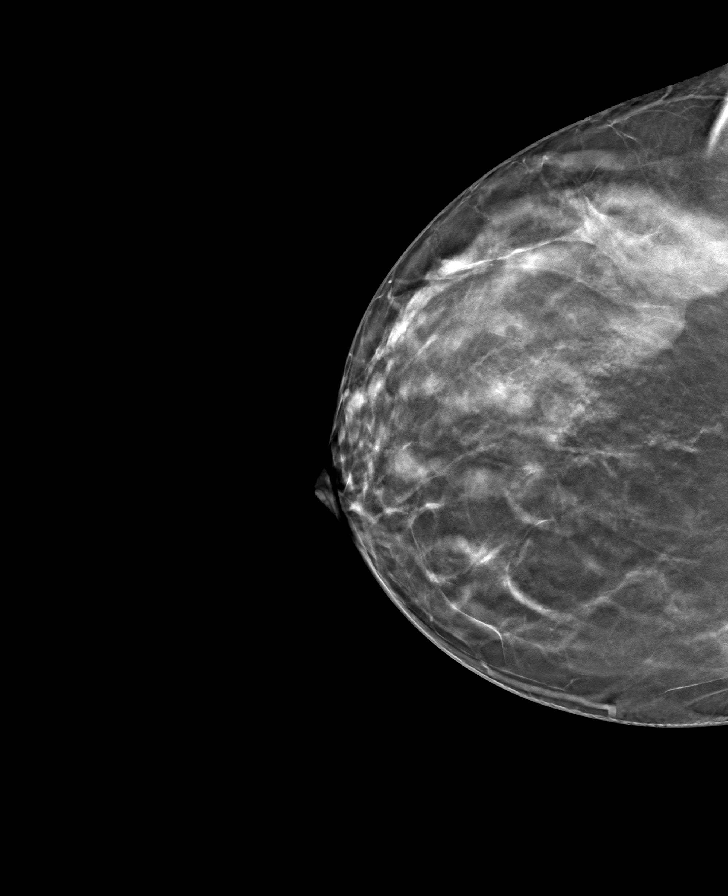

[L CC tomo · tomo slice 37/73.0]
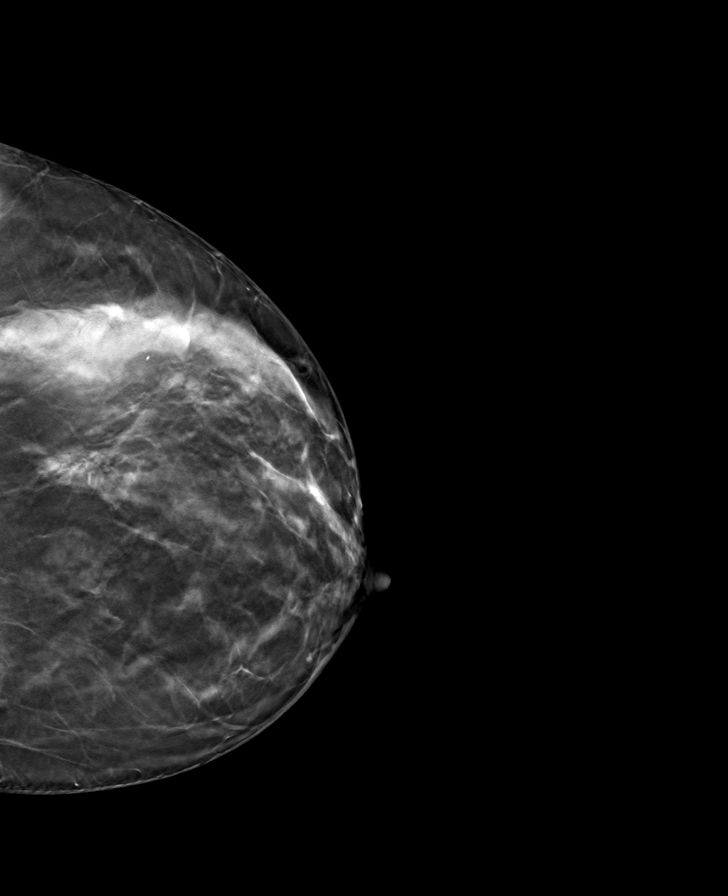

[R MLO tomo · tomo slice 39/77.0]
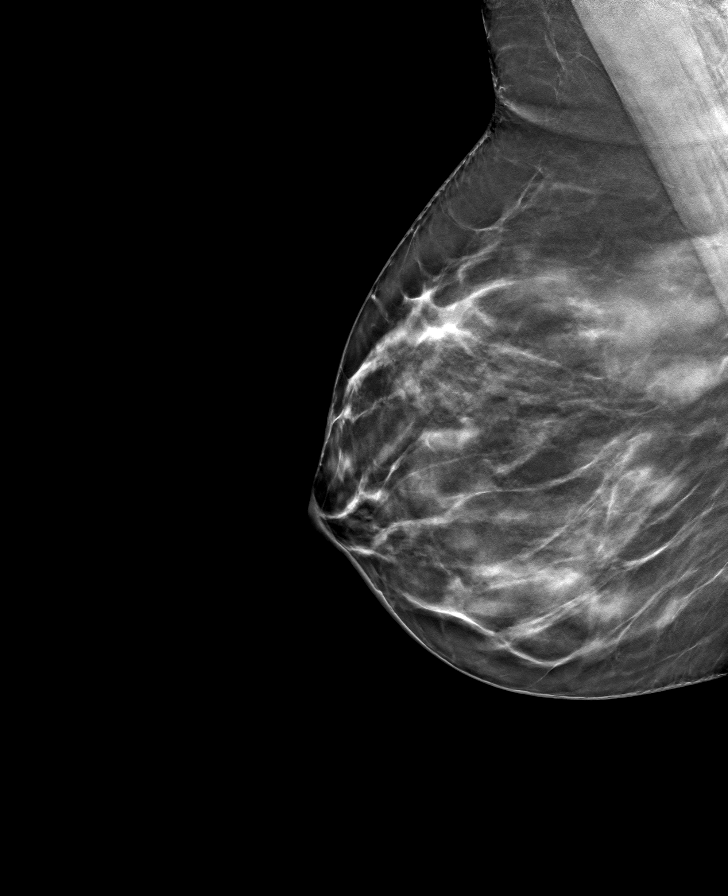

[8 of 24 positions shown; findings below may reference images not displayed]

ACR Breast Density Category c: The breast tissue is heterogeneously
dense, which may obscure small masses.
FINDINGS: There are no findings suspicious for malignancy.
IMPRESSION: No mammographic evidence of malignancy. A result letter of this
screening mammogram will be mailed directly to the patient.

RECOMMENDATION:
Screening mammogram in one year. (Code:Q3-W-BC3)

BI-RADS CATEGORY  1: Negative.

## 2023-09-08 ENCOUNTER — Other Ambulatory Visit: Payer: Self-pay | Admitting: Family Medicine

## 2023-09-08 DIAGNOSIS — Z1231 Encounter for screening mammogram for malignant neoplasm of breast: Secondary | ICD-10-CM

## 2023-10-19 ENCOUNTER — Ambulatory Visit
Admission: RE | Admit: 2023-10-19 | Discharge: 2023-10-19 | Disposition: A | Discharge status: Home / Self Care | Payer: Self-pay | Source: Ambulatory Visit | Attending: Family Medicine

## 2023-10-19 DIAGNOSIS — Z1231 Encounter for screening mammogram for malignant neoplasm of breast: Secondary | ICD-10-CM
